# Patient Record
Sex: Male | Born: 1990 | Race: Black or African American | Hispanic: No | Marital: Single | Smoking: Never smoker
Health system: Southern US, Community
[De-identification: ages and names within clinical notes are randomized; demographics above are authoritative.]

## PROBLEM LIST (undated history)

## (undated) DIAGNOSIS — L409 Psoriasis, unspecified: Secondary | ICD-10-CM

## (undated) DIAGNOSIS — S61219A Laceration without foreign body of unspecified finger without damage to nail, initial encounter: Secondary | ICD-10-CM

## (undated) HISTORY — PX: NO PAST SURGERIES: SHX2092

---

## 2016-08-11 ENCOUNTER — Emergency Department (HOSPITAL_COMMUNITY)
Admission: EM | Admit: 2016-08-11 | Discharge: 2016-08-11 | Disposition: A | Discharge status: Home / Self Care | Payer: Self-pay | Attending: Emergency Medicine | Admitting: Emergency Medicine

## 2016-08-11 ENCOUNTER — Emergency Department (HOSPITAL_COMMUNITY): Payer: Self-pay

## 2016-08-11 ENCOUNTER — Encounter (HOSPITAL_COMMUNITY): Payer: Self-pay | Admitting: Oncology

## 2016-08-11 DIAGNOSIS — W25XXXA Contact with sharp glass, initial encounter: Secondary | ICD-10-CM | POA: Insufficient documentation

## 2016-08-11 DIAGNOSIS — IMO0002 Reserved for concepts with insufficient information to code with codable children: Secondary | ICD-10-CM

## 2016-08-11 DIAGNOSIS — S61219A Laceration without foreign body of unspecified finger without damage to nail, initial encounter: Secondary | ICD-10-CM

## 2016-08-11 DIAGNOSIS — Y9389 Activity, other specified: Secondary | ICD-10-CM | POA: Insufficient documentation

## 2016-08-11 DIAGNOSIS — S66921A Laceration of unspecified muscle, fascia and tendon at wrist and hand level, right hand, initial encounter: Secondary | ICD-10-CM | POA: Insufficient documentation

## 2016-08-11 DIAGNOSIS — S61210A Laceration without foreign body of right index finger without damage to nail, initial encounter: Secondary | ICD-10-CM | POA: Insufficient documentation

## 2016-08-11 DIAGNOSIS — Y9259 Other trade areas as the place of occurrence of the external cause: Secondary | ICD-10-CM | POA: Insufficient documentation

## 2016-08-11 DIAGNOSIS — Y999 Unspecified external cause status: Secondary | ICD-10-CM | POA: Insufficient documentation

## 2016-08-11 HISTORY — DX: Laceration without foreign body of unspecified finger without damage to nail, initial encounter: S61.219A

## 2016-08-11 HISTORY — DX: Psoriasis, unspecified: L40.9

## 2016-08-11 MED ORDER — BACITRACIN ZINC 500 UNIT/GM EX OINT
TOPICAL_OINTMENT | Freq: Two times a day (BID) | CUTANEOUS | Status: DC
  Administered 2016-08-11: 1 via TOPICAL
  Filled 2016-08-11: qty 0.9

## 2016-08-11 MED ORDER — LIDOCAINE HCL (PF) 1 % IJ SOLN: 2.0000 mL | Freq: Once | INTRAMUSCULAR | Status: DC

## 2016-08-11 MED ORDER — LIDOCAINE HCL 1 % IJ SOLN
INTRAMUSCULAR | Status: AC
  Filled 2016-08-11: qty 20
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
## 2016-08-11 NOTE — Discharge Instructions (Signed)
Tonight your evaluated for laceration to the right index finger, it was evident that you have a laceration to the flexor tendon.  I did suture the skin and placed her finger in a splint.  It is very, very, very important that you call Dr. Mina MarbleWeingold Call to schedule an appointment for evaluation and repair of this tendon so that you can flex your index finger
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
## 2016-08-13 ENCOUNTER — Other Ambulatory Visit: Payer: Self-pay | Admitting: Orthopedic Surgery

## 2016-08-13 ENCOUNTER — Encounter (HOSPITAL_BASED_OUTPATIENT_CLINIC_OR_DEPARTMENT_OTHER): Payer: Self-pay | Admitting: *Deleted

## 2016-08-16 ENCOUNTER — Ambulatory Visit (HOSPITAL_BASED_OUTPATIENT_CLINIC_OR_DEPARTMENT_OTHER): Payer: Self-pay | Admitting: Anesthesiology

## 2016-08-16 ENCOUNTER — Encounter (HOSPITAL_BASED_OUTPATIENT_CLINIC_OR_DEPARTMENT_OTHER): Payer: Self-pay | Admitting: Anesthesiology

## 2016-08-16 ENCOUNTER — Ambulatory Visit (HOSPITAL_BASED_OUTPATIENT_CLINIC_OR_DEPARTMENT_OTHER)
Admission: RE | Admit: 2016-08-16 | Discharge: 2016-08-16 | Disposition: A | Discharge status: Home / Self Care | Payer: Self-pay | Source: Ambulatory Visit | Attending: Orthopedic Surgery | Admitting: Orthopedic Surgery

## 2016-08-16 ENCOUNTER — Encounter (HOSPITAL_BASED_OUTPATIENT_CLINIC_OR_DEPARTMENT_OTHER)
Admission: RE | Disposition: A | Discharge status: Home / Self Care | Payer: Self-pay | Source: Ambulatory Visit | Attending: Orthopedic Surgery

## 2016-08-16 DIAGNOSIS — S66120A Laceration of flexor muscle, fascia and tendon of right index finger at wrist and hand level, initial encounter: Secondary | ICD-10-CM | POA: Insufficient documentation

## 2016-08-16 DIAGNOSIS — L409 Psoriasis, unspecified: Secondary | ICD-10-CM | POA: Insufficient documentation

## 2016-08-16 DIAGNOSIS — X58XXXA Exposure to other specified factors, initial encounter: Secondary | ICD-10-CM | POA: Insufficient documentation

## 2016-08-16 HISTORY — PX: NERVE, TENDON AND ARTERY REPAIR: SHX5695

## 2016-08-16 HISTORY — DX: Laceration without foreign body of unspecified finger without damage to nail, initial encounter: S61.219A

## 2016-08-16 SURGERY — NERVE, TENDON AND ARTERY REPAIR
Anesthesia: General | Site: Finger | Laterality: Right

## 2016-08-16 MED ORDER — DEXAMETHASONE SODIUM PHOSPHATE 4 MG/ML IJ SOLN
INTRAMUSCULAR | Status: DC | PRN
  Administered 2016-08-16: 10 mg via INTRAVENOUS

## 2016-08-16 MED ORDER — FENTANYL CITRATE (PF) 100 MCG/2ML IJ SOLN
INTRAMUSCULAR | Status: AC
  Filled 2016-08-16: qty 2

## 2016-08-16 MED ORDER — BUPIVACAINE HCL (PF) 0.25 % IJ SOLN
INTRAMUSCULAR | Status: DC | PRN
  Administered 2016-08-16: 3 mL

## 2016-08-16 MED ORDER — LIDOCAINE HCL (PF) 1 % IJ SOLN
INTRAMUSCULAR | Status: AC
  Filled 2016-08-16: qty 30

## 2016-08-16 MED ORDER — MIDAZOLAM HCL 2 MG/2ML IJ SOLN
INTRAMUSCULAR | Status: AC
  Filled 2016-08-16: qty 2

## 2016-08-16 MED ORDER — OXYCODONE-ACETAMINOPHEN 5-325 MG PO TABS: 1.0000 | ORAL_TABLET | ORAL | 0 refills | Status: DC | PRN

## 2016-08-16 MED ORDER — FENTANYL CITRATE (PF) 100 MCG/2ML IJ SOLN
50.0000 ug | INTRAMUSCULAR | Status: AC | PRN
  Administered 2016-08-16: 100 ug via INTRAVENOUS
  Administered 2016-08-16 (×2): 25 ug via INTRAVENOUS

## 2016-08-16 MED ORDER — FENTANYL CITRATE (PF) 100 MCG/2ML IJ SOLN
25.0000 ug | INTRAMUSCULAR | Status: DC | PRN
  Administered 2016-08-16: 50 ug via INTRAVENOUS

## 2016-08-16 MED ORDER — GLYCOPYRROLATE 0.2 MG/ML IJ SOLN: 0.2000 mg | Freq: Once | INTRAMUSCULAR | Status: DC | PRN

## 2016-08-16 MED ORDER — DEXAMETHASONE SODIUM PHOSPHATE 10 MG/ML IJ SOLN
INTRAMUSCULAR | Status: AC
  Filled 2016-08-16: qty 1

## 2016-08-16 MED ORDER — PROMETHAZINE HCL 25 MG/ML IJ SOLN: 6.2500 mg | INTRAMUSCULAR | Status: DC | PRN

## 2016-08-16 MED ORDER — LACTATED RINGERS IV SOLN
INTRAVENOUS | Status: DC
  Administered 2016-08-16 (×2): via INTRAVENOUS

## 2016-08-16 MED ORDER — LIDOCAINE 2% (20 MG/ML) 5 ML SYRINGE
INTRAMUSCULAR | Status: DC | PRN
  Administered 2016-08-16: 100 mg via INTRAVENOUS

## 2016-08-16 MED ORDER — ONDANSETRON HCL 4 MG/2ML IJ SOLN
INTRAMUSCULAR | Status: DC | PRN
  Administered 2016-08-16: 4 mg via INTRAVENOUS

## 2016-08-16 MED ORDER — ONDANSETRON HCL 4 MG/2ML IJ SOLN
INTRAMUSCULAR | Status: AC
  Filled 2016-08-16: qty 2

## 2016-08-16 MED ORDER — HEPARIN SODIUM (PORCINE) 1000 UNIT/ML IJ SOLN
INTRAMUSCULAR | Status: AC
  Filled 2016-08-16: qty 1

## 2016-08-16 MED ORDER — LIDOCAINE HCL 2 % IJ SOLN
INTRAMUSCULAR | Status: AC
  Filled 2016-08-16: qty 20

## 2016-08-16 MED ORDER — MIDAZOLAM HCL 2 MG/2ML IJ SOLN
1.0000 mg | INTRAMUSCULAR | Status: DC | PRN
  Administered 2016-08-16: 2 mg via INTRAVENOUS

## 2016-08-16 MED ORDER — CEFAZOLIN SODIUM-DEXTROSE 2-4 GM/100ML-% IV SOLN
INTRAVENOUS | Status: AC
  Filled 2016-08-16: qty 100

## 2016-08-16 MED ORDER — LIDOCAINE 2% (20 MG/ML) 5 ML SYRINGE
INTRAMUSCULAR | Status: AC
  Filled 2016-08-16: qty 5

## 2016-08-16 MED ORDER — BUPIVACAINE HCL (PF) 0.25 % IJ SOLN
INTRAMUSCULAR | Status: AC
  Filled 2016-08-16: qty 30

## 2016-08-16 MED ORDER — PROPOFOL 10 MG/ML IV BOLUS
INTRAVENOUS | Status: DC | PRN
  Administered 2016-08-16: 200 mg via INTRAVENOUS

## 2016-08-16 MED ORDER — CHLORHEXIDINE GLUCONATE 4 % EX LIQD: 60.0000 mL | Freq: Once | CUTANEOUS | Status: DC

## 2016-08-16 MED ORDER — CEFAZOLIN SODIUM-DEXTROSE 2-4 GM/100ML-% IV SOLN
2.0000 g | INTRAVENOUS | Status: AC
  Administered 2016-08-16: 2 g via INTRAVENOUS

## 2016-08-16 MED ORDER — SCOPOLAMINE 1 MG/3DAYS TD PT72: 1.0000 | MEDICATED_PATCH | Freq: Once | TRANSDERMAL | Status: DC | PRN

## 2016-08-16 SURGICAL SUPPLY — 67 items
APL SKNCLS STERI-STRIP NONHPOA (GAUZE/BANDAGES/DRESSINGS)
BAG DECANTER FOR FLEXI CONT (MISCELLANEOUS) IMPLANT
BANDAGE ACE 3X5.8 VEL STRL LF (GAUZE/BANDAGES/DRESSINGS) ×3 IMPLANT
BANDAGE ACE 4X5 VEL STRL LF (GAUZE/BANDAGES/DRESSINGS) IMPLANT
BENZOIN TINCTURE PRP APPL 2/3 (GAUZE/BANDAGES/DRESSINGS) IMPLANT
BLADE MINI RND TIP GREEN BEAV (BLADE) ×3 IMPLANT
BLADE SURG 15 STRL LF DISP TIS (BLADE) ×1 IMPLANT
BLADE SURG 15 STRL SS (BLADE) ×3
BNDG CMPR 9X4 STRL LF SNTH (GAUZE/BANDAGES/DRESSINGS) ×1
BNDG COHESIVE 1X5 TAN STRL LF (GAUZE/BANDAGES/DRESSINGS) IMPLANT
BNDG ELASTIC 2X5.8 VLCR STR LF (GAUZE/BANDAGES/DRESSINGS) ×6 IMPLANT
BNDG ESMARK 4X9 LF (GAUZE/BANDAGES/DRESSINGS) ×3 IMPLANT
BNDG GAUZE ELAST 4 BULKY (GAUZE/BANDAGES/DRESSINGS) ×3 IMPLANT
CLOSURE WOUND 1/2 X4 (GAUZE/BANDAGES/DRESSINGS)
CORDS BIPOLAR (ELECTRODE) ×3 IMPLANT
COVER BACK TABLE 60X90IN (DRAPES) ×3 IMPLANT
CUFF TOURNIQUET SINGLE 18IN (TOURNIQUET CUFF) ×3 IMPLANT
DECANTER SPIKE VIAL GLASS SM (MISCELLANEOUS) IMPLANT
DRAPE EXTREMITY T 121X128X90 (DRAPE) ×3 IMPLANT
DRAPE SURG 17X23 STRL (DRAPES) ×3 IMPLANT
DURAPREP 26ML APPLICATOR (WOUND CARE) ×3 IMPLANT
GAUZE SPONGE 4X4 12PLY STRL (GAUZE/BANDAGES/DRESSINGS) ×3 IMPLANT
GAUZE SPONGE 4X4 16PLY XRAY LF (GAUZE/BANDAGES/DRESSINGS) IMPLANT
GAUZE XEROFORM 1X8 LF (GAUZE/BANDAGES/DRESSINGS) ×3 IMPLANT
GLOVE BIO SURGEON STRL SZ7 (GLOVE) ×3 IMPLANT
GLOVE BIOGEL M STRL SZ7.5 (GLOVE) ×3 IMPLANT
GLOVE SURG SYN 8.0 (GLOVE) ×3 IMPLANT
GOWN STRL REUS W/ TWL LRG LVL3 (GOWN DISPOSABLE) IMPLANT
GOWN STRL REUS W/ TWL XL LVL3 (GOWN DISPOSABLE) ×1 IMPLANT
GOWN STRL REUS W/TWL LRG LVL3 (GOWN DISPOSABLE)
GOWN STRL REUS W/TWL XL LVL3 (GOWN DISPOSABLE) ×9 IMPLANT
IV LACTATED RINGERS 500ML (IV SOLUTION) IMPLANT
NEEDLE HYPO 25X1 1.5 SAFETY (NEEDLE) ×3 IMPLANT
NS IRRIG 1000ML POUR BTL (IV SOLUTION) ×3 IMPLANT
PACK BASIN DAY SURGERY FS (CUSTOM PROCEDURE TRAY) ×3 IMPLANT
PAD CAST 3X4 CTTN HI CHSV (CAST SUPPLIES) ×1 IMPLANT
PADDING CAST ABS 4INX4YD NS (CAST SUPPLIES)
PADDING CAST ABS COTTON 4X4 ST (CAST SUPPLIES) IMPLANT
PADDING CAST COTTON 3X4 STRL (CAST SUPPLIES) ×3
PADDING UNDERCAST 2 STRL (CAST SUPPLIES)
PADDING UNDERCAST 2X4 STRL (CAST SUPPLIES) IMPLANT
PASSER SUT SWANSON 36MM LOOP (INSTRUMENTS) IMPLANT
SHEET MEDIUM DRAPE 40X70 STRL (DRAPES) ×3 IMPLANT
SLEEVE SCD COMPRESS KNEE MED (MISCELLANEOUS) ×3 IMPLANT
SPEAR EYE SURG WECK-CEL (MISCELLANEOUS) IMPLANT
SPLINT PLASTER CAST XFAST 4X15 (CAST SUPPLIES) ×15 IMPLANT
SPLINT PLASTER XTRA FAST SET 4 (CAST SUPPLIES) ×30
STOCKINETTE 4X48 STRL (DRAPES) ×3 IMPLANT
STRIP CLOSURE SKIN 1/2X4 (GAUZE/BANDAGES/DRESSINGS) IMPLANT
SUT ETHIBOND 3-0 V-5 (SUTURE) ×6 IMPLANT
SUT ETHILON 4 0 PS 2 18 (SUTURE) ×6 IMPLANT
SUT ETHILON 5 0 PS 2 18 (SUTURE) IMPLANT
SUT FIBERWIRE 3-0 18 TAPR NDL (SUTURE)
SUT FIBERWIRE 4-0 18 TAPR NDL (SUTURE)
SUT MERSILENE 4 0 P 3 (SUTURE) ×3 IMPLANT
SUT NYLON 9 0 VRM6 (SUTURE) IMPLANT
SUT PROLENE 3 0 PS 2 (SUTURE) IMPLANT
SUT PROLENE 6 0 P 1 18 (SUTURE) ×3 IMPLANT
SUT VICRYL RAPIDE 4-0 (SUTURE) IMPLANT
SUT VICRYL RAPIDE 4/0 PS 2 (SUTURE) IMPLANT
SUTURE FIBERWR 3-0 18 TAPR NDL (SUTURE) IMPLANT
SUTURE FIBERWR 4-0 18 TAPR NDL (SUTURE) IMPLANT
SYR BULB 3OZ (MISCELLANEOUS) ×3 IMPLANT
SYRINGE 10CC LL (SYRINGE) ×3 IMPLANT
TOWEL OR 17X24 6PK STRL BLUE (TOWEL DISPOSABLE) ×3 IMPLANT
TUBE FEEDING 5FR 15 INCH (TUBING) IMPLANT
UNDERPAD 30X30 (UNDERPADS AND DIAPERS) ×3 IMPLANT

## 2016-08-16 NOTE — H&P (Signed)
Jacob Morrison is an 25 y.o. male.   Chief Complaint: right index volar laceration with decreased flexion and sensation HPI: as above with deep right index volar lac  Past Medical History:  Diagnosis Date  . Finger laceration involving tendon 08/11/2016   right index  . Psoriasis    back and legs    Past Surgical History:  Procedure Laterality Date  . NO PAST SURGERIES      Family History  Problem Relation Age of Onset  . Evelene CroonWolff Parkinson White syndrome Father    Social History:  reports that he has never smoked. He has never used smokeless tobacco. He reports that he drinks alcohol. He reports that he does not use drugs.  Allergies:  Allergies  Allergen Reactions  . Lactose Intolerance (Gi) Other (See Comments)    GI UPSET    Medications Prior to Admission  Medication Sig Dispense Refill  . acetaminophen (TYLENOL) 325 MG tablet Take 650 mg by mouth every 6 (six) hours as needed.    . fluocinolone (SYNALAR) 0.025 % ointment Apply topically 2 (two) times daily.    . fluticasone (CUTIVATE) 0.05 % cream Apply topically 2 (two) times daily.    . halobetasol (ULTRAVATE) 0.05 % cream Apply topically 2 (two) times daily.      No results found for this or any previous visit (from the past 48 hour(s)). No results found.  Review of Systems  All other systems reviewed and are negative.   Blood pressure 125/61, pulse 64, temperature 98.5 F (36.9 C), temperature source Oral, resp. rate 18, height 5' 8.5" (1.74 m), weight 72.3 kg (159 lb 6 oz), SpO2 100 %. Physical Exam  Constitutional: He is oriented to person, place, and time. He appears well-developed and well-nourished.  HENT:  Head: Normocephalic and atraumatic.  Neck: Normal range of motion.  Cardiovascular: Normal rate.   Respiratory: Effort normal.  Musculoskeletal:       Right hand: He exhibits disruption of two-point discrimination and laceration.  Complex right index volar laceration with loss of flexion and  sensation  Neurological: He is alert and oriented to person, place, and time.  Skin: Skin is warm.  Psychiatric: He has a normal mood and affect. His behavior is normal. Judgment and thought content normal.     Assessment/Plan As above   Plan explore and repair  Marlowe ShoresWEINGOLD,Shivani Barrantes A, MD 08/16/2016, 12:27 PM

## 2016-08-16 NOTE — Op Note (Signed)
See note [id number]

## 2016-08-16 NOTE — Anesthesia Postprocedure Evaluation (Signed)
Anesthesia Post Note  Patient: Jacob Morrison  Procedure(s) Performed: Procedure(s) (LRB): Explore  and repair as needed NERVE, TENDON AND ARTERY right index finger, tendon repair (Right)  Patient location during evaluation: PACU Anesthesia Type: General Level of consciousness: awake and alert Pain management: pain level controlled Vital Signs Assessment: post-procedure vital signs reviewed and stable Respiratory status: spontaneous breathing, nonlabored ventilation, respiratory function stable and patient connected to nasal cannula oxygen Cardiovascular status: blood pressure returned to baseline and stable Postop Assessment: no signs of nausea or vomiting Anesthetic complications: no    Last Vitals:  Vitals:   08/16/16 1348 08/16/16 1400  BP: 129/71 133/74  Pulse: 70 60  Resp: 15 14  Temp: 36.5 C     Last Pain:  Vitals:   08/16/16 1400  TempSrc:   PainSc: 5                  Cecile HearingStephen Edward Domique Reardon

## 2016-08-16 NOTE — Transfer of Care (Signed)
Immediate Anesthesia Transfer of Care Note  Patient: Jacob Morrison  Procedure(s) Performed: Procedure(s): Explore  and repair as needed NERVE, TENDON AND ARTERY right index finger (Right)  Patient Location: PACU  Anesthesia Type:General  Level of Consciousness: sedated, patient cooperative and lethargic  Airway & Oxygen Therapy: Patient Spontanous Breathing and Patient connected to face mask oxygen  Post-op Assessment: Report given to RN and Post -op Vital signs reviewed and stable  Post vital signs: Reviewed and stable  Last Vitals:  Vitals:   08/16/16 1111  BP: 125/61  Pulse: 64  Resp: 18  Temp: 36.9 C    Last Pain:  Vitals:   08/16/16 1111  TempSrc: Oral  PainSc: 1          Complications: No apparent anesthesia complications

## 2016-08-16 NOTE — Anesthesia Preprocedure Evaluation (Addendum)
Anesthesia Evaluation  Patient identified by MRN, date of birth, ID band Patient awake    Reviewed: Allergy & Precautions, NPO status , Patient's Chart, lab work & pertinent test results  Airway Mallampati: I  TM Distance: >3 FB Neck ROM: Full    Dental  (+) Teeth Intact, Dental Advisory Given   Pulmonary neg pulmonary ROS,    Pulmonary exam normal breath sounds clear to auscultation       Cardiovascular Exercise Tolerance: Good negative cardio ROS Normal cardiovascular exam Rhythm:Regular Rate:Normal     Neuro/Psych negative neurological ROS  negative psych ROS   GI/Hepatic negative GI ROS, Neg liver ROS,   Endo/Other  negative endocrine ROS  Renal/GU negative Renal ROS     Musculoskeletal negative musculoskeletal ROS (+) Finger laceration Psoriasis   Abdominal   Peds negative pediatric ROS (+)  Hematology negative hematology ROS (+)   Anesthesia Other Findings Day of surgery medications reviewed with the patient.  Reproductive/Obstetrics                            Anesthesia Physical Anesthesia Plan  ASA: I  Anesthesia Plan: General   Post-op Pain Management:    Induction: Intravenous  Airway Management Planned: LMA  Additional Equipment:   Intra-op Plan:   Post-operative Plan: Extubation in OR  Informed Consent: I have reviewed the patients History and Physical, chart, labs and discussed the procedure including the risks, benefits and alternatives for the proposed anesthesia with the patient or authorized representative who has indicated his/her understanding and acceptance.   Dental advisory given  Plan Discussed with: CRNA  Anesthesia Plan Comments: (Risks/benefits of general anesthesia discussed with patient including risk of damage to teeth, lips, gum, and tongue, nausea/vomiting, allergic reactions to medications, and the possibility of heart attack, stroke and  death.  All patient questions answered.  Patient wishes to proceed.)       Anesthesia Quick Evaluation

## 2016-08-16 NOTE — Anesthesia Procedure Notes (Signed)
Procedure Name: LMA Insertion Date/Time: 08/16/2016 12:36 PM Performed by: Gar GibbonKEETON, Oma Marzan S Pre-anesthesia Checklist: Patient identified, Emergency Drugs available, Suction available and Patient being monitored Patient Re-evaluated:Patient Re-evaluated prior to inductionOxygen Delivery Method: Circle system utilized Preoxygenation: Pre-oxygenation with 100% oxygen Intubation Type: IV induction Ventilation: Mask ventilation without difficulty LMA: LMA inserted LMA Size: 4.0 Number of attempts: 1 Airway Equipment and Method: Bite block Placement Confirmation: positive ETCO2 Tube secured with: Tape Dental Injury: Teeth and Oropharynx as per pre-operative assessment

## 2016-08-16 NOTE — Discharge Instructions (Signed)

## 2016-08-19 ENCOUNTER — Encounter (HOSPITAL_BASED_OUTPATIENT_CLINIC_OR_DEPARTMENT_OTHER): Payer: Self-pay | Admitting: Orthopedic Surgery

## 2016-08-19 NOTE — Op Note (Signed)
NAMHenri Medal:  Biernat, Kekai            ACCOUNT NO.:  [id number]  MEDICAL RECORD NO.:  [id number]  LOCATION:                                 FACILITY:  PHYSICIAN:  Artist PaisMatthew A. Rudolph Dobler, M.D.DATE OF BIRTH:  01/19/1991  DATE OF PROCEDURE:  08/16/2016 DATE OF DISCHARGE:                              OPERATIVE REPORT   PREOPERATIVE DIAGNOSIS:  Right index volar laceration with loss of flexion.  POSTOPERATIVE DIAGNOSIS:  Right index volar laceration with loss of flexion.  PROCEDURE:  Exploration of above with primary repair of zone 2 flexor digitorum profundus and superficialis tendon.  SURGEON:  Artist PaisMatthew A. Mina MarbleWeingold, M.D.  ASSISTANT:  None.  ANESTHESIA:  General.  COMPLICATION:  None.  DRAINS:  None.  DESCRIPTION OF PROCEDURE:  The patient was taken to the operating suite after induction of adequate general anesthetic.  Right upper extremity was prepped and draped in sterile fashion.  An Esmarch was used to exsanguinate the limb.  Tourniquet was inflated to 250 mmHg.  At this point in time, a volar laceration over the proximal interphalangeal joint flexion crease, was extended proximally and distally in gentle S fashion with the proximal limb going radial and the distal limb going ulnar, flaps were raised, dissection was carried down between the A2 and A4 pulleys.  There was a complete laceration of the profundus tendon and a laceration of the ulnar side superficialis slip.  The wound was thoroughly irrigated and debrided of clot.  We then repaired the superficialis tendon slip using 4-0 Mersilene and a 6-0 Prolene epitendinous stitch.  This was then followed by repair of the profundus tendon using 3-0 double-armed Ethibond __________ type suture, followed by 6-0 Prolene locked epitendinous stitch.  The patient had good passive motion of the tendons through the pulley system.  The wound was irrigated.  We loosely closed part of the flexor sheath between the A2 and A4 pulleys with  a 6-0 Prolene.  Then, closed the skin with 4-0 nylon.  Xeroform, 4x4s, compression wrap, and a dorsal extension block splint were applied.  The patient tolerated and went to the recovery room in stable fashion.     Artist PaisMatthew A. Mina MarbleWeingold, M.D.   ______________________________ Artist PaisMatthew A. Mina MarbleWeingold, M.D.    MAW/MEDQ  D:  08/16/2016  T:  08/17/2016  Job:  [id number]

## 2018-10-13 ENCOUNTER — Emergency Department (HOSPITAL_COMMUNITY)
Admission: EM | Admit: 2018-10-13 | Discharge: 2018-10-13 | Disposition: A | Discharge status: Home / Self Care | Payer: BLUE CROSS/BLUE SHIELD | Attending: Emergency Medicine | Admitting: Emergency Medicine

## 2018-10-13 ENCOUNTER — Other Ambulatory Visit: Payer: Self-pay

## 2018-10-13 ENCOUNTER — Encounter (HOSPITAL_COMMUNITY): Payer: Self-pay | Admitting: Obstetrics and Gynecology

## 2018-10-13 ENCOUNTER — Emergency Department (HOSPITAL_COMMUNITY): Payer: BLUE CROSS/BLUE SHIELD

## 2018-10-13 DIAGNOSIS — Y9367 Activity, basketball: Secondary | ICD-10-CM | POA: Insufficient documentation

## 2018-10-13 DIAGNOSIS — S62614A Displaced fracture of proximal phalanx of right ring finger, initial encounter for closed fracture: Secondary | ICD-10-CM | POA: Insufficient documentation

## 2018-10-13 DIAGNOSIS — Y998 Other external cause status: Secondary | ICD-10-CM | POA: Insufficient documentation

## 2018-10-13 DIAGNOSIS — X58XXXA Exposure to other specified factors, initial encounter: Secondary | ICD-10-CM | POA: Insufficient documentation

## 2018-10-13 DIAGNOSIS — Y92310 Basketball court as the place of occurrence of the external cause: Secondary | ICD-10-CM | POA: Insufficient documentation

## 2018-10-13 MED ORDER — HYDROCODONE-ACETAMINOPHEN 5-325 MG PO TABS: 1.0000 | ORAL_TABLET | ORAL | 0 refills | Status: AC | PRN

## 2018-10-13 NOTE — Discharge Instructions (Addendum)
Call Dr. Debby Budoley's office in the morning for an appointment to schedule surgery for your finger. Do not drive if you take the narcotic pain medication. You may take ibuprofen in addition to the narcotic.

## 2018-10-13 NOTE — ED Triage Notes (Signed)
Pt reports he was playing basketball and jammed his finger. Pt reports it is not extremely painful and he went to urgent care and had his finger x-rayed and they sent him to the ER for a broken finger. Pt has x-ray disc with him

## 2018-10-13 NOTE — ED Provider Notes (Signed)
[facility]-EMERGENCY DEPT Provider Note   CSN: [id number] Arrival date & time: 10/13/18  1527     History   Chief Complaint Chief Complaint  Patient presents with  . Finger Injury    HPI Jacob Morrison is a 27 y.o. male who presents to the ED from Urgent Care after he was playing basketball and injured his right ring finger. Patient c/o pain and deformity. Patient reports that at Urgent Care they told him they spoke with the doctor on call and he wanted the patient to come to the ED.   HPI  Past Medical History:  Diagnosis Date  . Finger laceration involving tendon 08/11/2016   right index  . Psoriasis    back and legs    There are no active problems to display for this patient.   Past Surgical History:  Procedure Laterality Date  . NERVE, TENDON AND ARTERY REPAIR Right 08/16/2016   Procedure: Explore  and repair as needed NERVE, TENDON AND ARTERY right index finger, tendon repair;  Surgeon: Dairl Ponder, MD;  Location: [facility];  Service: Orthopedics;  Laterality: Right;  . NO PAST SURGERIES          Home Medications    Prior to Admission medications   Medication Sig Start Date End Date Taking? Authorizing Provider  acetaminophen (TYLENOL) 325 MG tablet Take 650 mg by mouth every 6 (six) hours as needed.    [provider]  fluocinolone (SYNALAR) 0.025 % ointment Apply topically 2 (two) times daily.    [provider]  fluticasone (CUTIVATE) 0.05 % cream Apply topically 2 (two) times daily.    [provider]  halobetasol (ULTRAVATE) 0.05 % cream Apply topically 2 (two) times daily.    [provider]  HYDROcodone-acetaminophen (NORCO/VICODIN) 5-325 MG tablet Take 1 tablet by mouth every 4 (four) hours as needed. 10/13/18   Janne Napoleon, NP    Family History Family History  Problem Relation Age of Onset  . Evelene Croon Parkinson White syndrome Father     Social History Social  History   Tobacco Use  . Smoking status: Never Smoker  . Smokeless tobacco: Never Used  Substance Use Topics  . Alcohol use: Yes    Alcohol/week: 7.0 standard drinks    Types: 7 Glasses of wine per week  . Drug use: Yes    Types: Marijuana    Comment: Social     Allergies   Lactose intolerance (gi)   Review of Systems Review of Systems  Musculoskeletal: Positive for arthralgias.       Right ring finger injury  All other systems reviewed and are negative.    Physical Exam Updated Vital Signs BP 131/79 (BP Location: Left Arm)   Pulse 66   Temp 98.4 F (36.9 C) (Oral)   Resp 16   Ht 5\' 8"  (1.727 m)   Wt 83.9 kg   SpO2 100%   BMI 28.13 kg/m   Physical Exam  Constitutional: He appears well-developed and well-nourished. No distress.  HENT:  Head: Normocephalic.  Eyes: EOM are normal.  Neck: Neck supple.  Cardiovascular: Normal rate.  Pulmonary/Chest: Effort normal.  Musculoskeletal:       Right hand: He exhibits deformity and swelling. Normal sensation noted.  Right ring finger with swelling and deformity. Adequate circulation, radial pulse 2+.  Neurological: He is alert.  Skin: Skin is warm and dry.  Psychiatric: He has a normal mood and affect.  Nursing note and vitals reviewed.  ED Treatments / Results  Labs (all labs ordered are listed, but only abnormal results are displayed) Labs Reviewed - No data to display  Radiology Dg Hand Complete Right  Result Date: 10/13/2018 CLINICAL DATA:  Basketball injury EXAM: RIGHT HAND - COMPLETE 3+ VIEW COMPARISON:  08/11/2016 FINDINGS: Mildly displaced fracture of the fourth proximal phalanx. No other fracture or arthropathy. IMPRESSION: Mildly displaced fracture fourth proximal phalanx. Electronically Signed   By: Marlan Palauharles  Clark M.D.   On: 10/13/2018 19:35    Procedures Procedures (including critical care time)  Medications Ordered in ED Medications - No data to display 8:10 pm Consult with Dr. Izora Ribasoley,  splint finger and call the office in the morning and he will see the patient and schedule surgery.   Initial Impression / Assessment and Plan / ED Course  I have reviewed the triage vital signs and the nursing notes. 27 y.o. male here with injury to the right ring finger stable for d/c after splint applied and no focal neuro deficits. X-ray with fracture. Patient to call Dr. Debby Budoley's office in the morning for follow up.   Final Clinical Impressions(s) / ED Diagnoses   Final diagnoses:  Closed displaced fracture of proximal phalanx of right ring finger, initial encounter    ED Discharge Orders         Ordered    HYDROcodone-acetaminophen (NORCO/VICODIN) 5-325 MG tablet  Every 4 hours PRN     10/13/18 2049           Kerrie Buffaloeese, Andre Swander [place], [place] 10/13/18 2051    Margarita Grizzleay, Danielle, MD 10/14/18 [phone]

## 2019-09-13 IMAGING — CR DG HAND COMPLETE 3+V*R*
4 series · 4 of 4 positions shown · non-contrast
Comparison: 08/11/2016

CLINICAL DATA: Basketball injury

EXAM:
RIGHT HAND - COMPLETE 3+ VIEW

[x hand pa right]
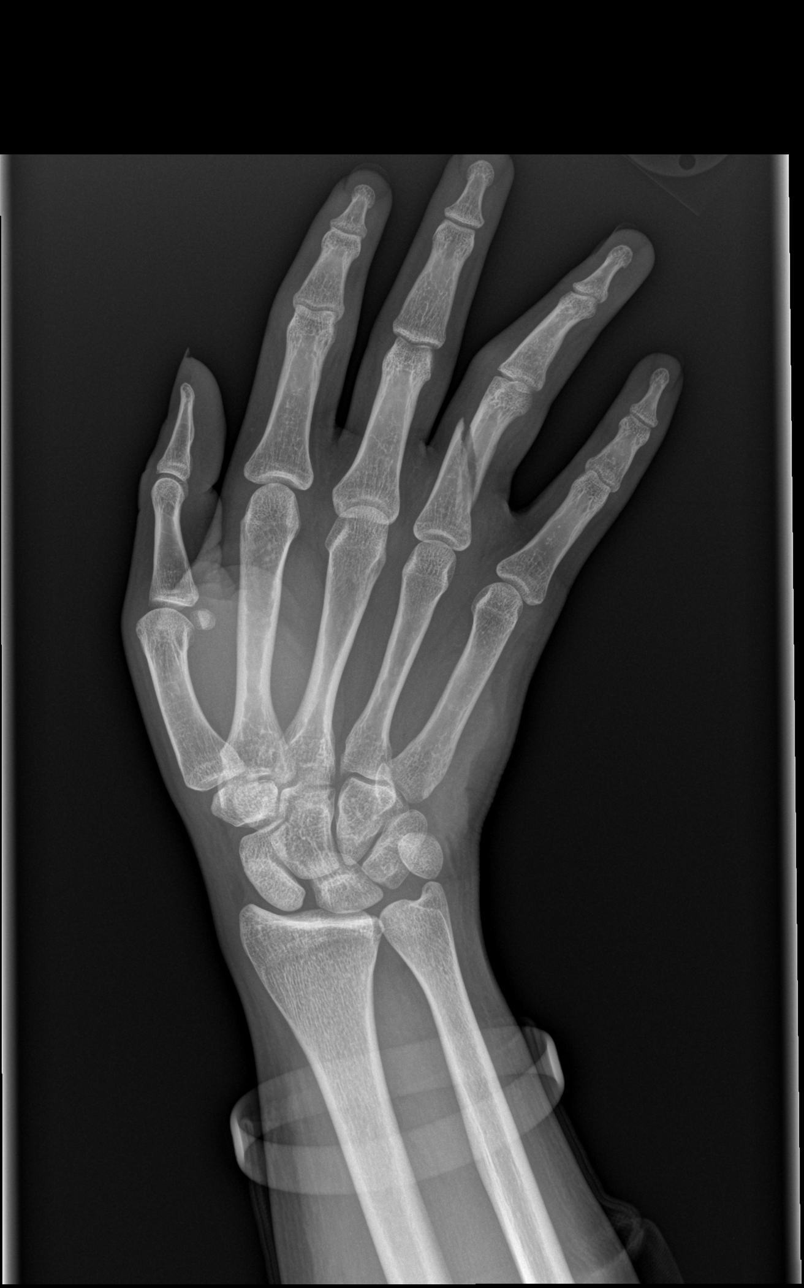

[x hand obl right]
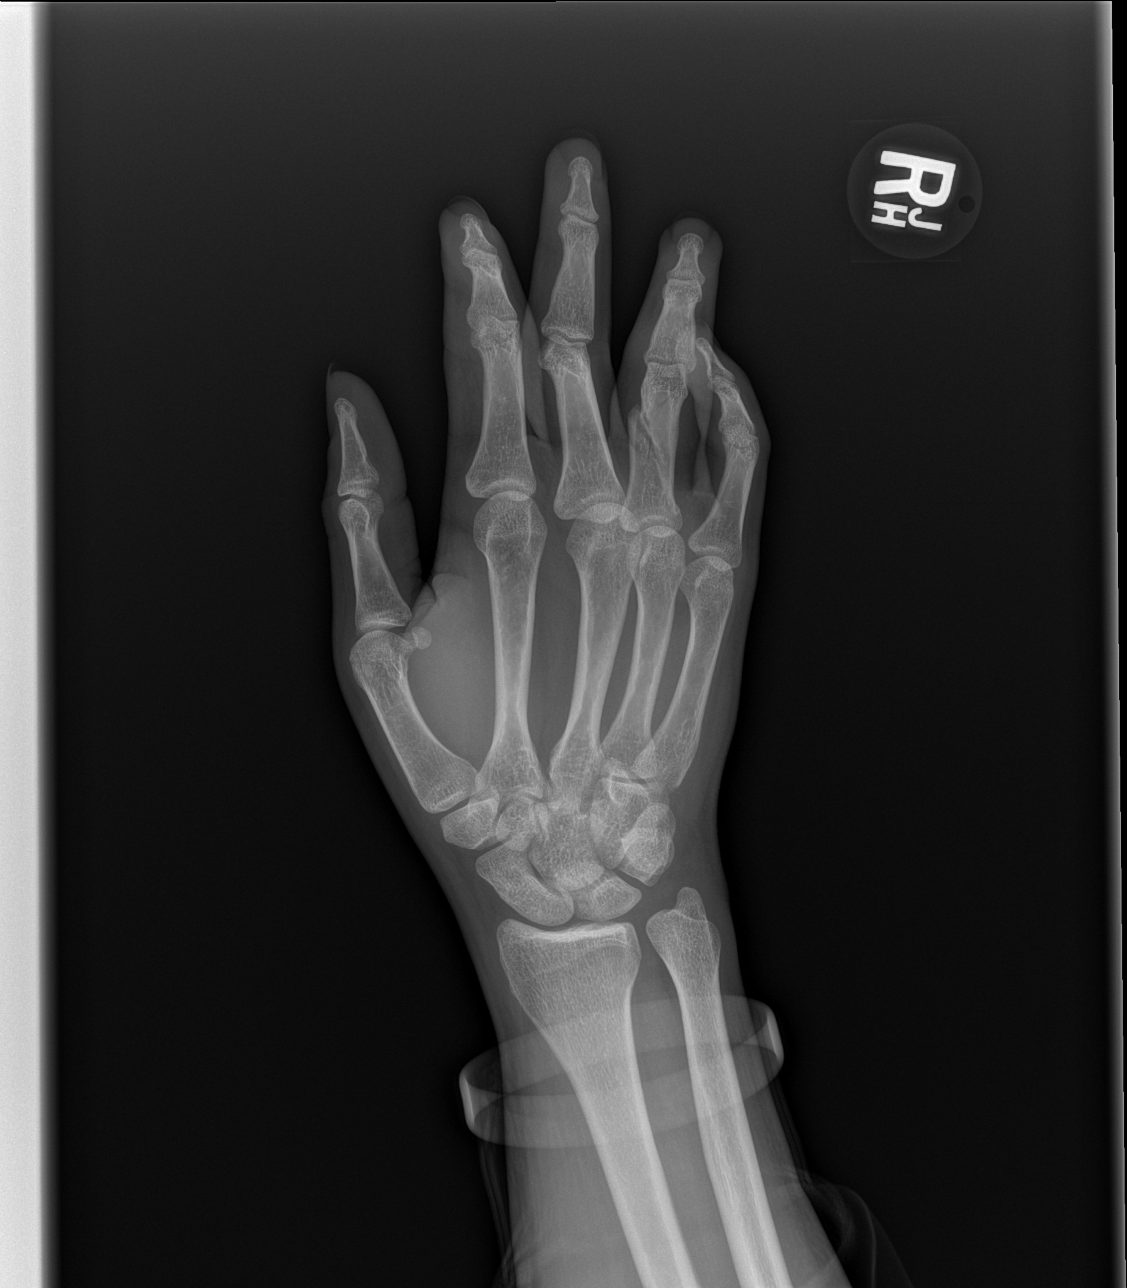

[x hand lat right (1 of 2)]
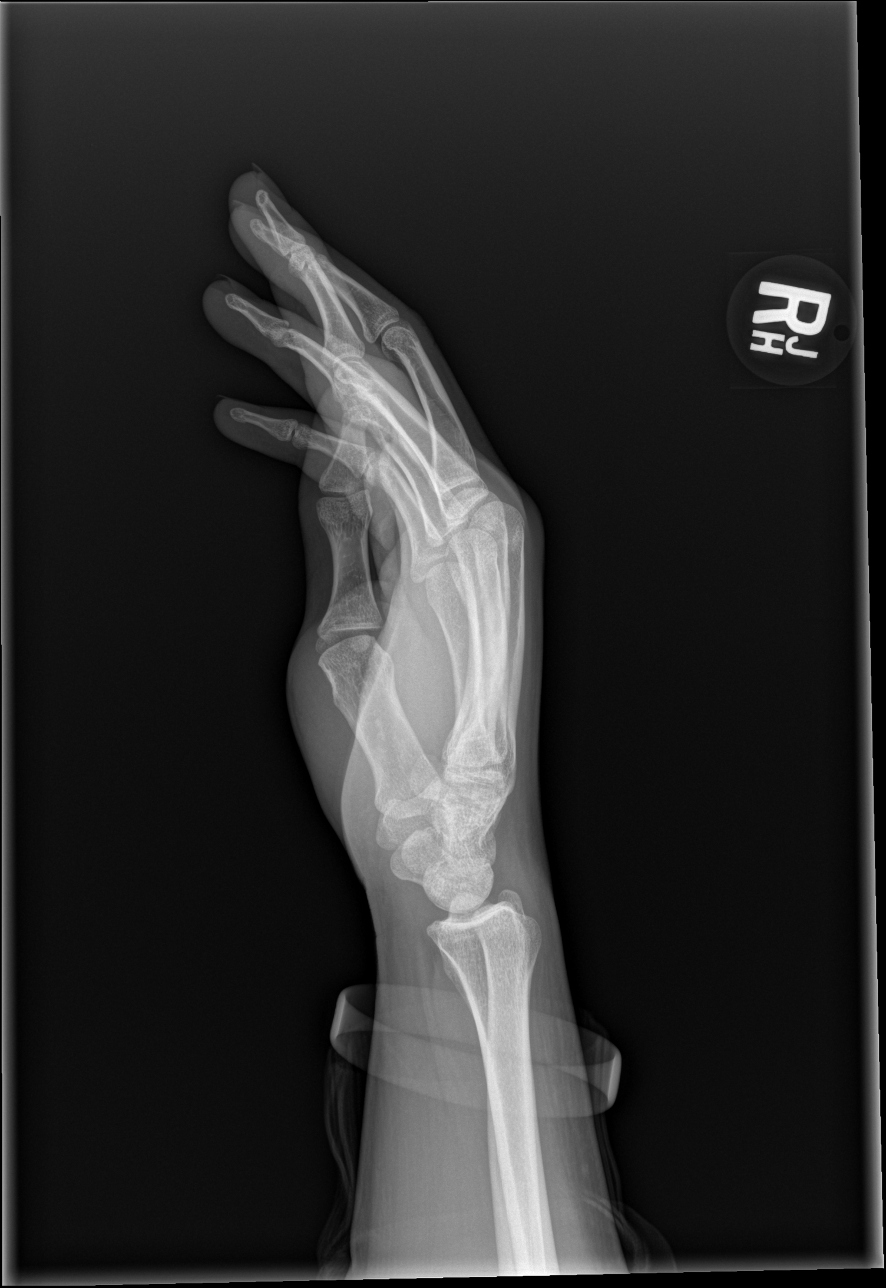

[x hand lat right (2 of 2)]
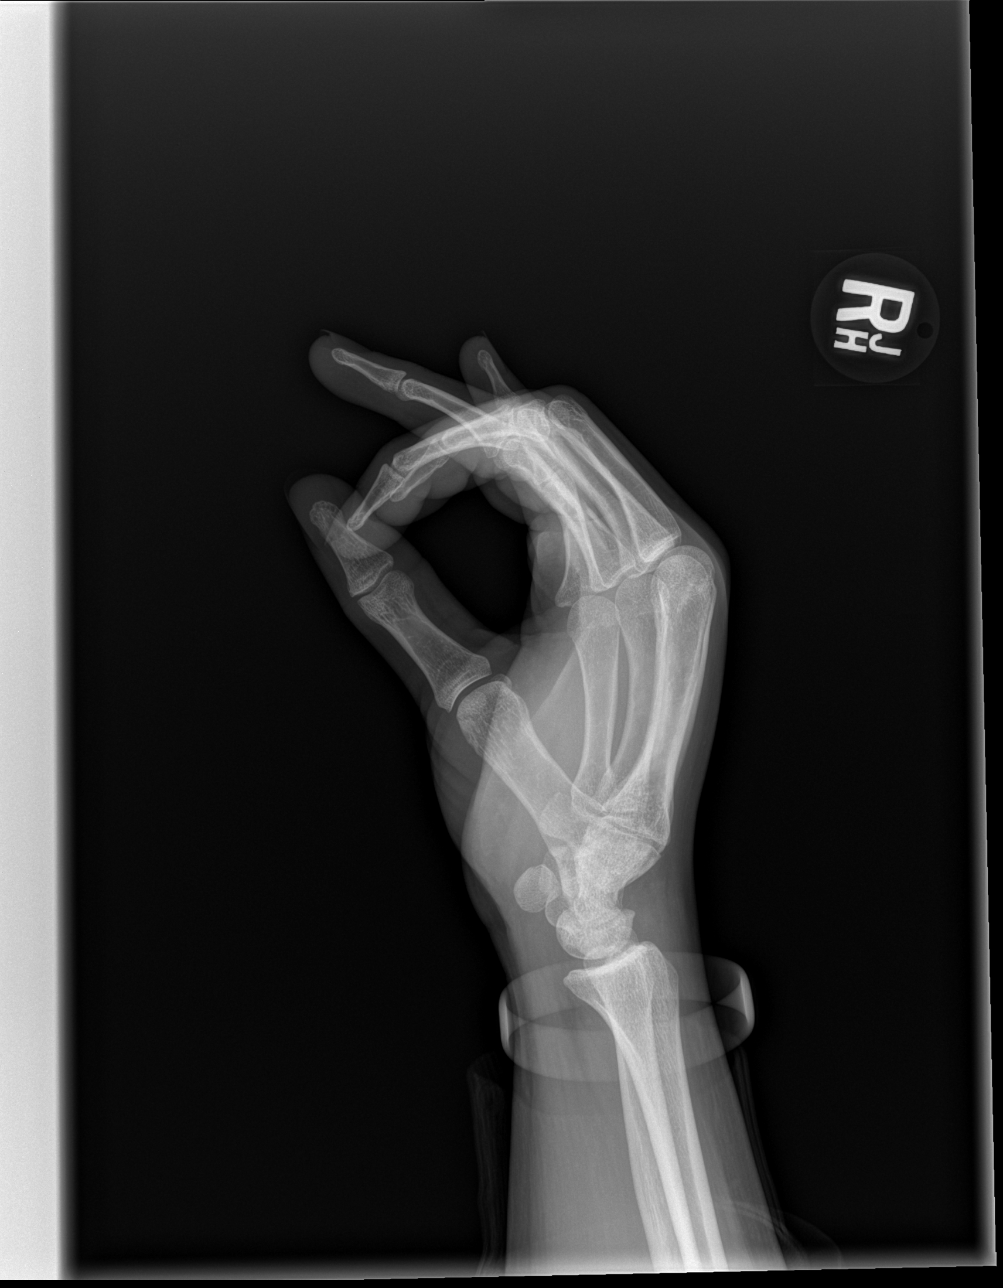

[4 of 4 positions shown; findings below may reference images not displayed]

FINDINGS: Mildly displaced fracture of the fourth proximal phalanx. No other
fracture or arthropathy.
IMPRESSION: Mildly displaced fracture fourth proximal phalanx.
